# Patient Record
Sex: Male | Born: 1970 | Race: White | Hispanic: No | Marital: Married
Health system: Southern US, Community
[De-identification: ages and names within clinical notes are randomized; demographics above are authoritative.]

---

## 2016-09-07 ENCOUNTER — Other Ambulatory Visit: Payer: Self-pay | Admitting: Urology

## 2016-09-26 ENCOUNTER — Encounter (HOSPITAL_BASED_OUTPATIENT_CLINIC_OR_DEPARTMENT_OTHER): Admission: RE | Payer: Self-pay | Source: Ambulatory Visit

## 2016-09-26 ENCOUNTER — Ambulatory Visit (HOSPITAL_BASED_OUTPATIENT_CLINIC_OR_DEPARTMENT_OTHER): Admission: RE | Admit: 2016-09-26 | Payer: Self-pay | Source: Ambulatory Visit | Admitting: Urology

## 2016-09-26 SURGERY — VASECTOMY
Anesthesia: Choice

## 2017-02-09 DIAGNOSIS — L309 Dermatitis, unspecified: Secondary | ICD-10-CM | POA: Diagnosis not present

## 2017-03-21 DIAGNOSIS — F3176 Bipolar disorder, in full remission, most recent episode depressed: Secondary | ICD-10-CM | POA: Diagnosis not present

## 2017-07-05 DIAGNOSIS — Z0000 Encounter for general adult medical examination without abnormal findings: Secondary | ICD-10-CM | POA: Diagnosis not present

## 2017-07-05 DIAGNOSIS — Z125 Encounter for screening for malignant neoplasm of prostate: Secondary | ICD-10-CM | POA: Diagnosis not present

## 2017-07-12 DIAGNOSIS — N183 Chronic kidney disease, stage 3 (moderate): Secondary | ICD-10-CM | POA: Diagnosis not present

## 2017-07-12 DIAGNOSIS — Z0000 Encounter for general adult medical examination without abnormal findings: Secondary | ICD-10-CM | POA: Diagnosis not present

## 2017-08-10 ENCOUNTER — Other Ambulatory Visit: Payer: Self-pay

## 2017-08-10 ENCOUNTER — Telehealth: Payer: Self-pay | Admitting: Cardiovascular Disease

## 2017-08-10 DIAGNOSIS — Z0000 Encounter for general adult medical examination without abnormal findings: Secondary | ICD-10-CM

## 2017-08-10 NOTE — Telephone Encounter (Addendum)
Per MD: "Can you please schedule him for CT calcium scoring ( cell # [phone]). Thanks. " Scheduled w/Stacy, January 17, 4:15 arrival  Left message for pt to contact the office.

## 2017-08-10 NOTE — Progress Notes (Unsigned)
Ct

## 2017-08-10 NOTE — Telephone Encounter (Signed)
Pt returned call. He is agreeable to scheduled CT calcium scoring date and time. He understands cost is $150 payable at the time of the test.  [facility] [address], [place].

## 2017-08-17 ENCOUNTER — Ambulatory Visit (INDEPENDENT_AMBULATORY_CARE_PROVIDER_SITE_OTHER)
Admission: RE | Admit: 2017-08-17 | Discharge: 2017-08-17 | Disposition: A | Discharge status: Home / Self Care | Payer: Self-pay | Source: Ambulatory Visit | Attending: Cardiovascular Disease | Admitting: Cardiovascular Disease

## 2017-08-17 DIAGNOSIS — Z0000 Encounter for general adult medical examination without abnormal findings: Secondary | ICD-10-CM

## 2017-10-17 DIAGNOSIS — F3176 Bipolar disorder, in full remission, most recent episode depressed: Secondary | ICD-10-CM | POA: Diagnosis not present

## 2017-11-29 DIAGNOSIS — H6691 Otitis media, unspecified, right ear: Secondary | ICD-10-CM | POA: Diagnosis not present

## 2018-01-04 DIAGNOSIS — H938X1 Other specified disorders of right ear: Secondary | ICD-10-CM | POA: Diagnosis not present

## 2018-04-18 DIAGNOSIS — F3176 Bipolar disorder, in full remission, most recent episode depressed: Secondary | ICD-10-CM | POA: Diagnosis not present

## 2018-07-11 DIAGNOSIS — Z125 Encounter for screening for malignant neoplasm of prostate: Secondary | ICD-10-CM | POA: Diagnosis not present

## 2018-07-11 DIAGNOSIS — N289 Disorder of kidney and ureter, unspecified: Secondary | ICD-10-CM | POA: Diagnosis not present

## 2018-07-11 DIAGNOSIS — Z0000 Encounter for general adult medical examination without abnormal findings: Secondary | ICD-10-CM | POA: Diagnosis not present

## 2018-07-18 DIAGNOSIS — N183 Chronic kidney disease, stage 3 (moderate): Secondary | ICD-10-CM | POA: Diagnosis not present

## 2018-07-18 DIAGNOSIS — Z0000 Encounter for general adult medical examination without abnormal findings: Secondary | ICD-10-CM | POA: Diagnosis not present

## 2018-11-20 DIAGNOSIS — F3176 Bipolar disorder, in full remission, most recent episode depressed: Secondary | ICD-10-CM | POA: Diagnosis not present

## 2018-12-19 ENCOUNTER — Other Ambulatory Visit: Payer: Self-pay | Admitting: Cardiovascular Disease

## 2018-12-19 MED ORDER — DOXYCYCLINE HYCLATE 100 MG PO CAPS: 100.0000 mg | ORAL_CAPSULE | Freq: Two times a day (BID) | ORAL | 0 refills | Status: AC

## 2019-01-11 ENCOUNTER — Other Ambulatory Visit (HOSPITAL_COMMUNITY): Payer: Self-pay | Admitting: Psychiatry

## 2019-09-04 IMAGING — CT CT HEART SCORING
2 series · 16 of 20 positions shown, 18 images · non-contrast
Comparison: None.

CLINICAL DATA: Risk stratification

EXAM:
Coronary Calcium Score
TECHNIQUE: The patient was scanned on a Siemens Somatom 64 slice scanner. Axial
non-contrast 3 mm slices were carried out through the heart. The
data set was analyzed on a dedicated work station and scored using
the Agatson method.

[Series 2: casc 3.0 i36f 2 bestdiast 66 % · axial · 0.38mm/px · z∈[-284,-158]mm · 8 of 54 slices shown, 10 images]
[im 6/54  vessel]
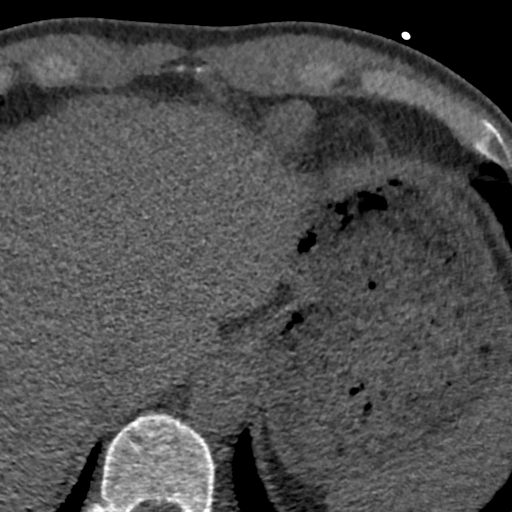
[im 6/54  lung]
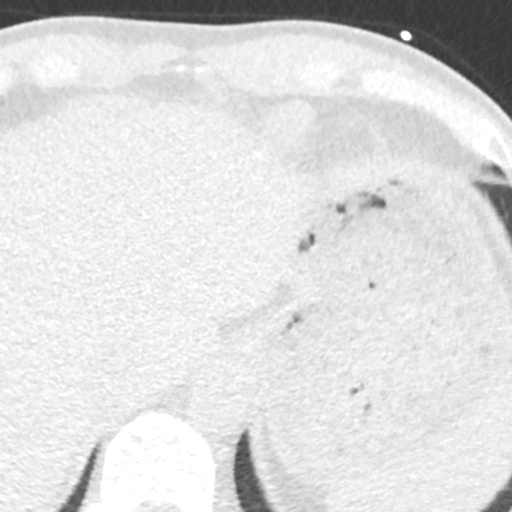
[im 12/54  vessel]
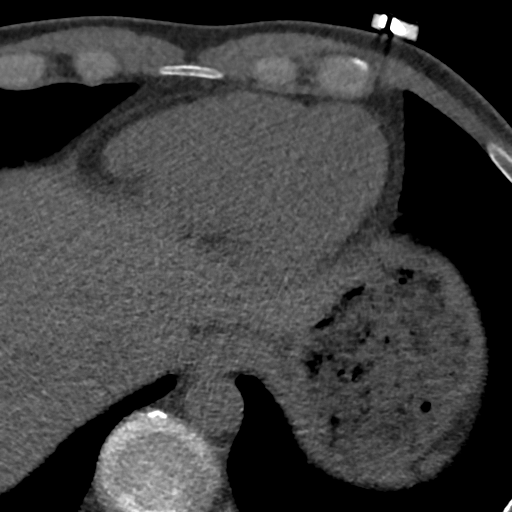
[im 18/54  vessel]
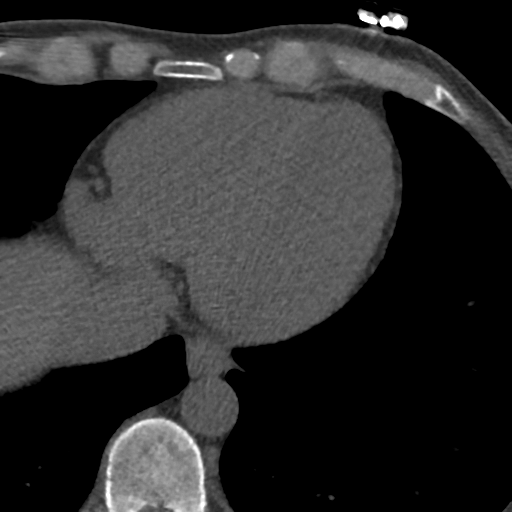
[im 24/54  vessel]
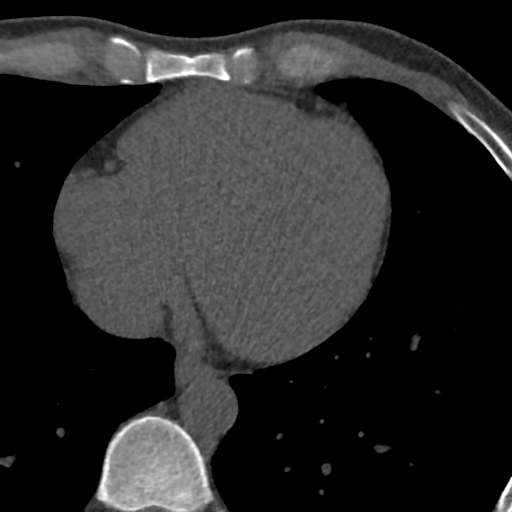
[im 30/54  vessel]
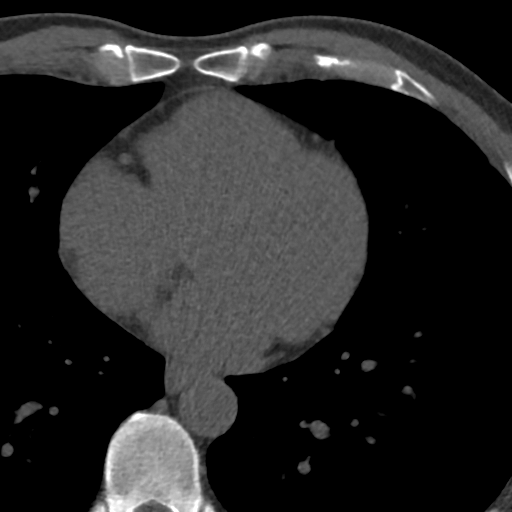
[im 30/54  lung]
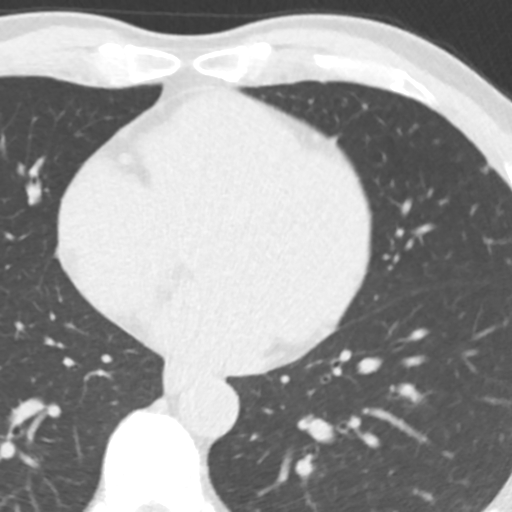
[im 36/54  vessel]
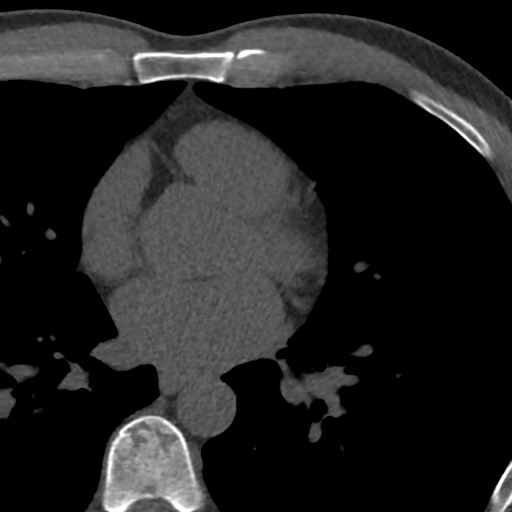
[im 42/54  vessel]
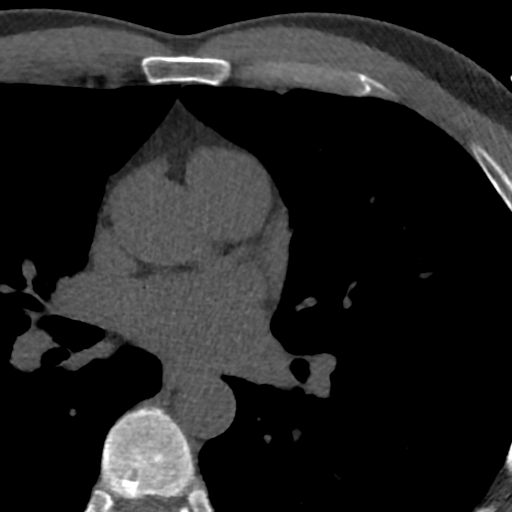
[im 48/54  vessel]
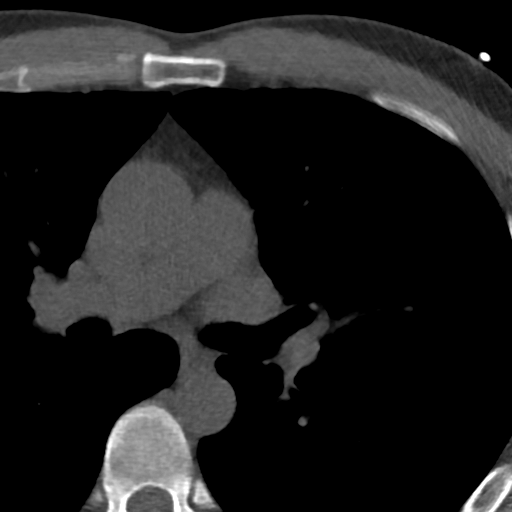

[Series 4: lung st 70 % · axial · 0.68mm/px · z∈[-284,-158]mm · 8 of 54 slices shown]
[im 6/54  lung]
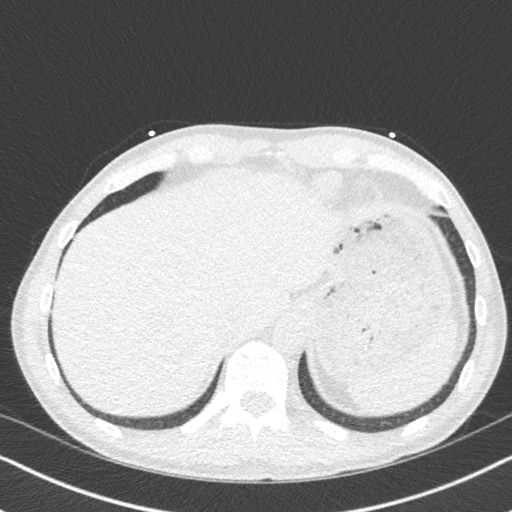
[im 12/54  lung]
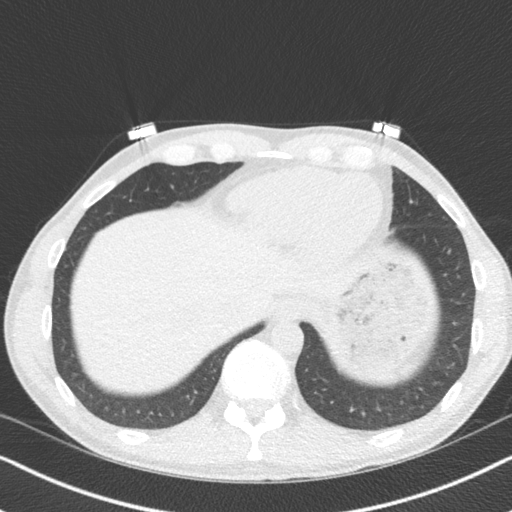
[im 18/54  lung]
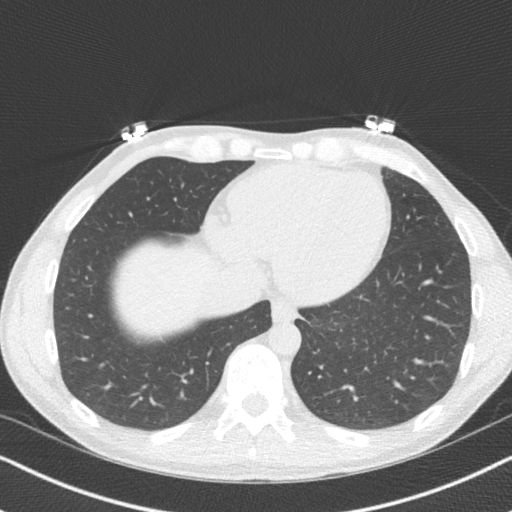
[im 24/54  lung]
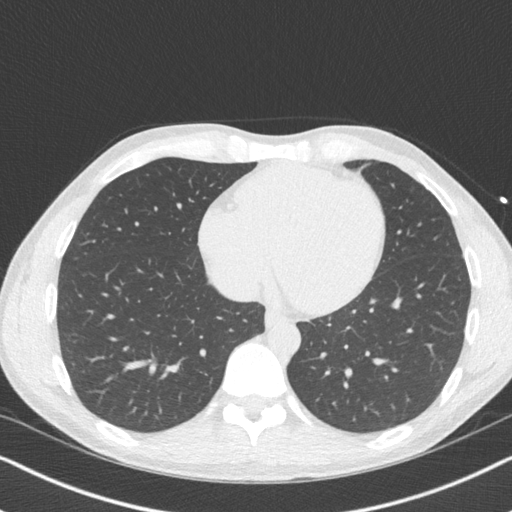
[im 30/54  lung]
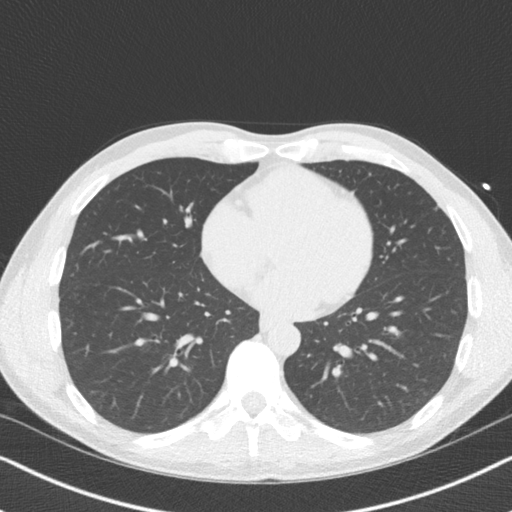
[im 36/54  lung]
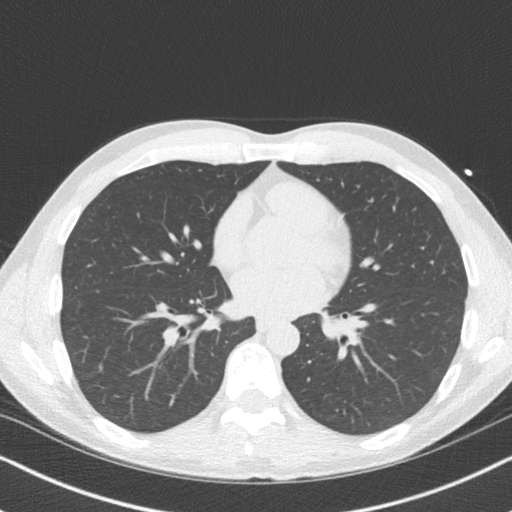
[im 42/54  lung]
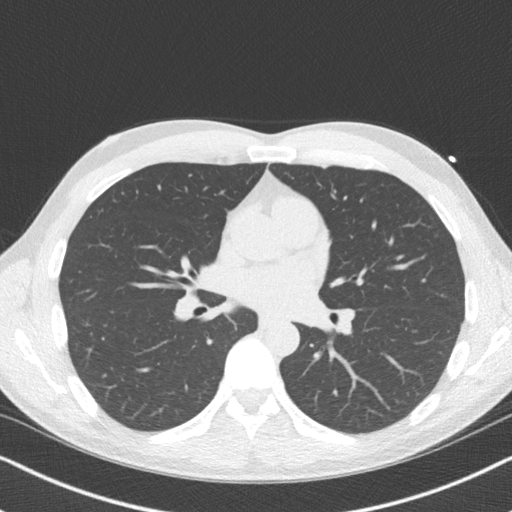
[im 48/54  lung]
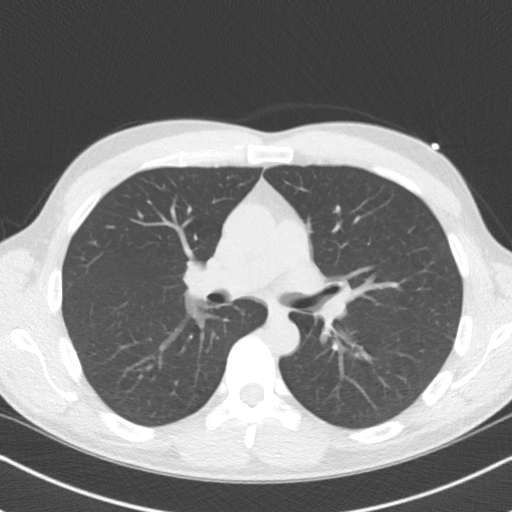

[16 of 20 positions shown; findings below may reference images not displayed]

FINDINGS: Non-cardiac: See separate report from [REDACTED].

Ascending Aorta: Normal diameter 3.4 cm

Pericardium: Normal

Coronary arteries: No calcium detected
IMPRESSION: Coronary calcium score of 0.

Francheskaa Para Adorarle

EXAM:
OVER-READ INTERPRETATION  CT CHEST

The following report is an over-read performed by radiologist Dr.
Maggy Mccorkle [REDACTED] on 08/17/2017. This
over-read does not include interpretation of cardiac or coronary
anatomy or pathology. The coronary calcium score/coronary CTA
interpretation by the cardiologist is attached.
FINDINGS: Within the visualized portions of the thorax there are no suspicious
appearing pulmonary nodules or masses, there is no acute
consolidative airspace disease, no pleural effusions, no
pneumothorax and no lymphadenopathy. Visualized portions of the
upper abdomen are unremarkable. There are no aggressive appearing
lytic or blastic lesions noted in the visualized portions of the
skeleton.
IMPRESSION: 1. No significant incidental noncardiac findings are noted.

## 2020-07-05 ENCOUNTER — Other Ambulatory Visit: Payer: Self-pay | Admitting: Cardiovascular Disease

## 2020-07-05 MED ORDER — DIVALPROEX SODIUM ER 500 MG PO TB24: 500.0000 mg | ORAL_TABLET | Freq: Two times a day (BID) | ORAL | 3 refills | Status: AC
